# Patient Record
Sex: Male | Born: 2000 | Race: White | Hispanic: No | Marital: Married | Smoking: Never smoker
Health system: Southern US, Community
[De-identification: ages and names within clinical notes are randomized; demographics above are authoritative.]

## PROBLEM LIST (undated history)

## (undated) DIAGNOSIS — F909 Attention-deficit hyperactivity disorder, unspecified type: Secondary | ICD-10-CM

## (undated) HISTORY — PX: OTHER SURGICAL HISTORY: SHX169

---
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
## 2021-05-14 ENCOUNTER — Other Ambulatory Visit: Payer: Self-pay

## 2021-05-14 ENCOUNTER — Emergency Department (HOSPITAL_COMMUNITY): Payer: BLUE CROSS/BLUE SHIELD

## 2021-05-14 ENCOUNTER — Emergency Department (HOSPITAL_COMMUNITY)
Admission: EM | Admit: 2021-05-14 | Discharge: 2021-05-14 | Disposition: A | Discharge status: Home / Self Care | Payer: BLUE CROSS/BLUE SHIELD | Attending: Emergency Medicine | Admitting: Emergency Medicine

## 2021-05-14 ENCOUNTER — Encounter (HOSPITAL_COMMUNITY): Payer: Self-pay | Admitting: Emergency Medicine

## 2021-05-14 DIAGNOSIS — F17200 Nicotine dependence, unspecified, uncomplicated: Secondary | ICD-10-CM | POA: Insufficient documentation

## 2021-05-14 DIAGNOSIS — R079 Chest pain, unspecified: Secondary | ICD-10-CM | POA: Diagnosis present

## 2021-05-14 DIAGNOSIS — R0789 Other chest pain: Secondary | ICD-10-CM | POA: Diagnosis not present

## 2021-05-14 LAB — TROPONIN I (HIGH SENSITIVITY)
Troponin I (High Sensitivity): <2 ng/L (ref <18)
Troponin I (High Sensitivity): <2 ng/L (ref <18)

## 2021-05-14 LAB — BASIC METABOLIC PANEL
Anion gap: 9 (ref 5–15)
BUN: 15 mg/dL (ref 6–20)
CO2: 25 mmol/L (ref 22–32)
Calcium: 9.2 mg/dL (ref 8.9–10.3)
Chloride: 104 mmol/L (ref 98–111)
Creatinine, Ser: 0.81 mg/dL (ref 0.61–1.24)
GFR, Estimated: >60 mL/min (ref >60)
Glucose, Bld: 100 mg/dL — ABNORMAL HIGH (ref 70–99)
Potassium: 3.8 mmol/L (ref 3.5–5.1)
Sodium: 138 mmol/L (ref 135–145)

## 2021-05-14 LAB — CBC
HCT: 48.2 % (ref 39.0–52.0)
Hemoglobin: 16.6 g/dL (ref 13.0–17.0)
MCH: 28.5 pg (ref 26.0–34.0)
MCHC: 34.4 g/dL (ref 30.0–36.0)
MCV: 82.7 fL (ref 80.0–100.0)
Platelets: 272 10*3/uL (ref 150–400)
RBC: 5.83 MIL/uL — ABNORMAL HIGH (ref 4.22–5.81)
RDW: 11.9 % (ref 11.5–15.5)
WBC: 8.7 10*3/uL (ref 4.0–10.5)
nRBC: 0 % (ref 0.0–0.2)

## 2021-05-14 NOTE — ED Triage Notes (Signed)
Pt reports he had sharp chest pain while at work. He was pushing a piece of equipment at the time. Reports pain is worse with a deep breath.

## 2021-05-14 NOTE — ED Provider Notes (Signed)
[facility] EMERGENCY DEPARTMENT Provider Note   CSN: [id number] Arrival date & time: 05/14/21  1042     History Chief Complaint  Patient presents with  . Chest Pain    Shane Palmer is a 20 y.o. male.  Patient presents to the ED for evaluation of sharp, anterior chest pain onset while at work. He was using a torque wrench at the time of the pain. Pain is primarily localized to the left upper anterior chest, mid-clavicular. Pain worse with deep inspiration and palpation. No shortness of breath, abdominal pain, nausea, vomiting, diaphoresis. Pain has improved, and has mostly abated at time of exam. No significant PMH. Early heart disease in family.   The history is provided by the patient.  Chest Pain Pain location:  L chest Pain quality: sharp   Pain radiates to:  Does not radiate Pain severity:  Moderate (at onset) Onset quality:  Sudden Progression:  Partially resolved Chronicity:  New Context: movement   Associated symptoms: no abdominal pain, no back pain, no cough, no diaphoresis, no lower extremity edema, no nausea, no shortness of breath, no syncope and no vomiting   Risk factors: male sex and smoking        Past Medical History:  Diagnosis Date  . ADHD (attention deficit hyperactivity disorder)     There are no problems to display for this patient.   Past Surgical History:  Procedure Laterality Date  . tubes in ears         Family History  Problem Relation Age of Onset  . Diabetes Other   . Heart failure Other     Social History   Tobacco Use  . Smoking status: Never Smoker  . Smokeless tobacco: Current User    Types: Chew  Substance Use Topics  . Alcohol use: No  . Drug use: No    Home Medications Prior to Admission medications   Medication Sig Start Date End Date Taking? Authorizing Provider  ibuprofen (ADVIL) 200 MG tablet Take 200 mg by mouth every 6 (six) hours as needed for fever, headache or mild pain.   Yes [provider]    Allergies    Patient has no known allergies.  Review of Systems   Review of Systems  Constitutional: Negative for diaphoresis.  Respiratory: Negative for cough and shortness of breath.   Cardiovascular: Positive for chest pain. Negative for syncope.  Gastrointestinal: Negative for abdominal pain, nausea and vomiting.  Musculoskeletal: Negative for back pain.  All other systems reviewed and are negative.   Physical Exam Updated Vital Signs BP 128/88   Pulse 71   Temp 98.7 F (37.1 C)   Resp 18   Ht 5\' 11"  (1.803 m)   Wt 103.9 kg   SpO2 98%   BMI 31.94 kg/m   Physical Exam Vitals and nursing note reviewed.  Constitutional:      General: He is not in acute distress.    Appearance: He is well-developed.  HENT:     Head: Normocephalic.  Eyes:     Pupils: Pupils are equal, round, and reactive to light.  Cardiovascular:     Rate and Rhythm: Normal rate and regular rhythm.     Heart sounds: Normal heart sounds.  Pulmonary:     Effort: Pulmonary effort is normal. No tachypnea.  Abdominal:     Palpations: Abdomen is soft.  Musculoskeletal:        General: Normal range of motion.     Cervical back: Normal  range of motion.  Skin:    General: Skin is warm and dry.  Neurological:     Mental Status: He is alert and oriented to person, place, and time.  Psychiatric:        Mood and Affect: Mood normal.        Behavior: Behavior normal.     ED Results / Procedures / Treatments   Labs (all labs ordered are listed, but only abnormal results are displayed) Labs Reviewed  BASIC METABOLIC PANEL - Abnormal; Notable for the following components:      Result Value   Glucose, Bld 100 (*)    All other components within normal limits  CBC - Abnormal; Notable for the following components:   RBC 5.83 (*)    All other components within normal limits  TROPONIN I (HIGH SENSITIVITY)  TROPONIN I (HIGH SENSITIVITY)    EKG None  Radiology DG Chest 2 View  Result Date:  05/14/2021 CLINICAL DATA:  Chest pain EXAM: CHEST - 2 VIEW COMPARISON:  11/20/2013 FINDINGS: Normal heart size and mediastinal contours. No acute infiltrate or edema. No effusion or pneumothorax. No acute osseous findings. IMPRESSION: No evidence of active disease. Electronically Signed   By: Marnee Spring M.D.   On: 05/14/2021 11:32    Procedures Procedures   Medications Ordered in ED Medications - No data to display  ED Course  I have reviewed the triage vital signs and the nursing notes.  Pertinent labs & imaging results that were available during my care of the patient were reviewed by me and considered in my medical decision making (see chart for details).    MDM Rules/Calculators/A&P                         Pain has mostly resolved. Likely MSK in origin.  Patient is to be discharged with recommendation to follow up with PCP in regards to today's hospital visit. Chest pain is not likely of cardiac or pulmonary etiology d/t presentation, perc negative, VSS, no tracheal deviation, no JVD or new murmur, RRR, breath sounds equal bilaterally, EKG without acute abnormalities, negative troponin, and negative CXR. Pt has been advised to return to the ED is CP becomes exertional, associated with diaphoresis or nausea, radiates to left jaw/arm, worsens or becomes concerning in any way. Pt appears reliable for follow up and is agreeable to discharge.    Final Clinical Impression(s) / ED Diagnoses Final diagnoses:  Chest wall pain    Rx / DC Orders ED Discharge Orders    None       Felicie Morn, NP 05/14/21 2322    Cheryll Cockayne, MD 05/17/21 2011

## 2021-05-14 NOTE — Discharge Instructions (Signed)
Ibuprofen and/or tylenol for discomfort. Please refer to the attached instructions.

## 2021-12-09 IMAGING — DX DG CHEST 2V
2 series · 2 of 2 positions shown · non-contrast
Comparison: 11/20/2013

CLINICAL DATA: Chest pain

EXAM:
CHEST - 2 VIEW

[chest pa]
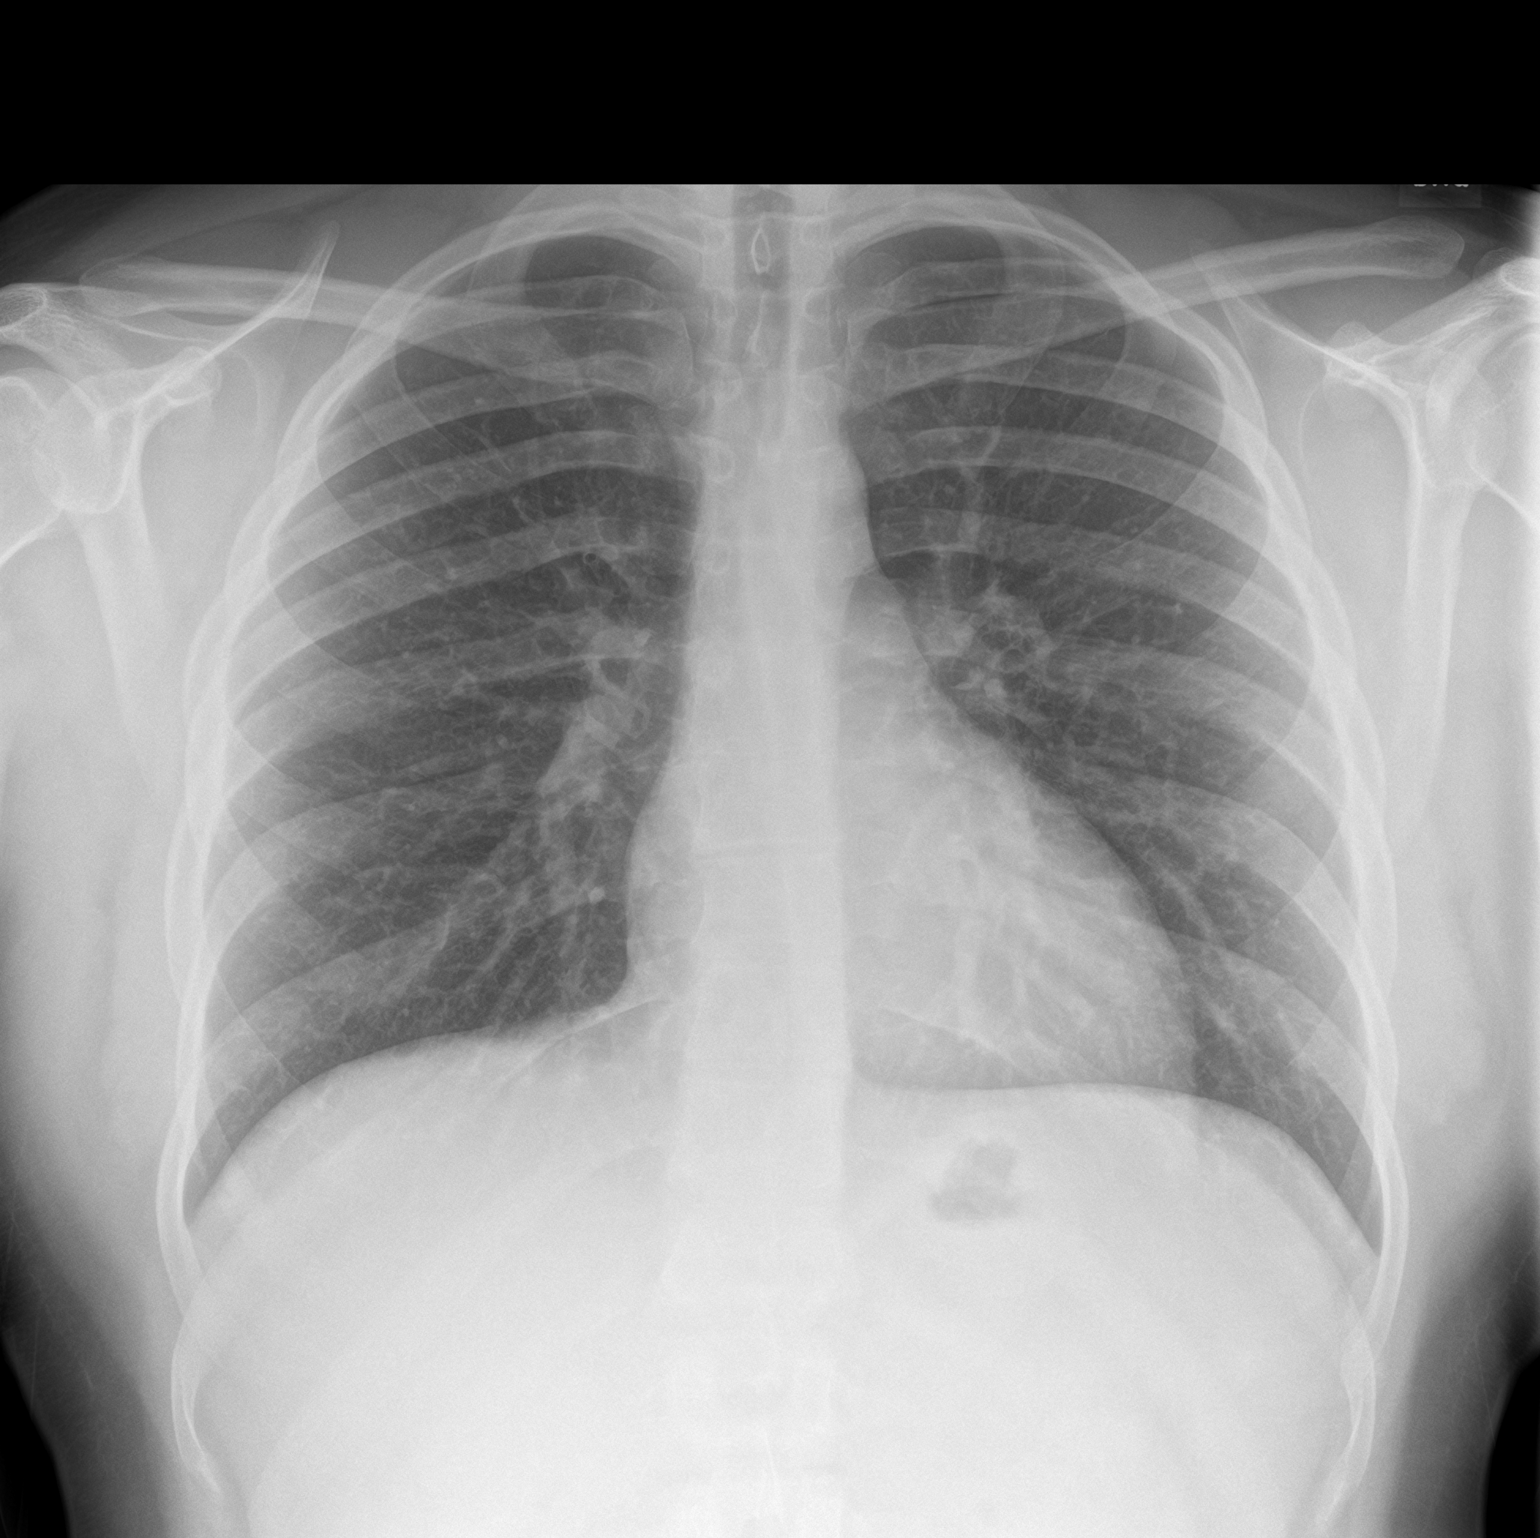

[chest lat]
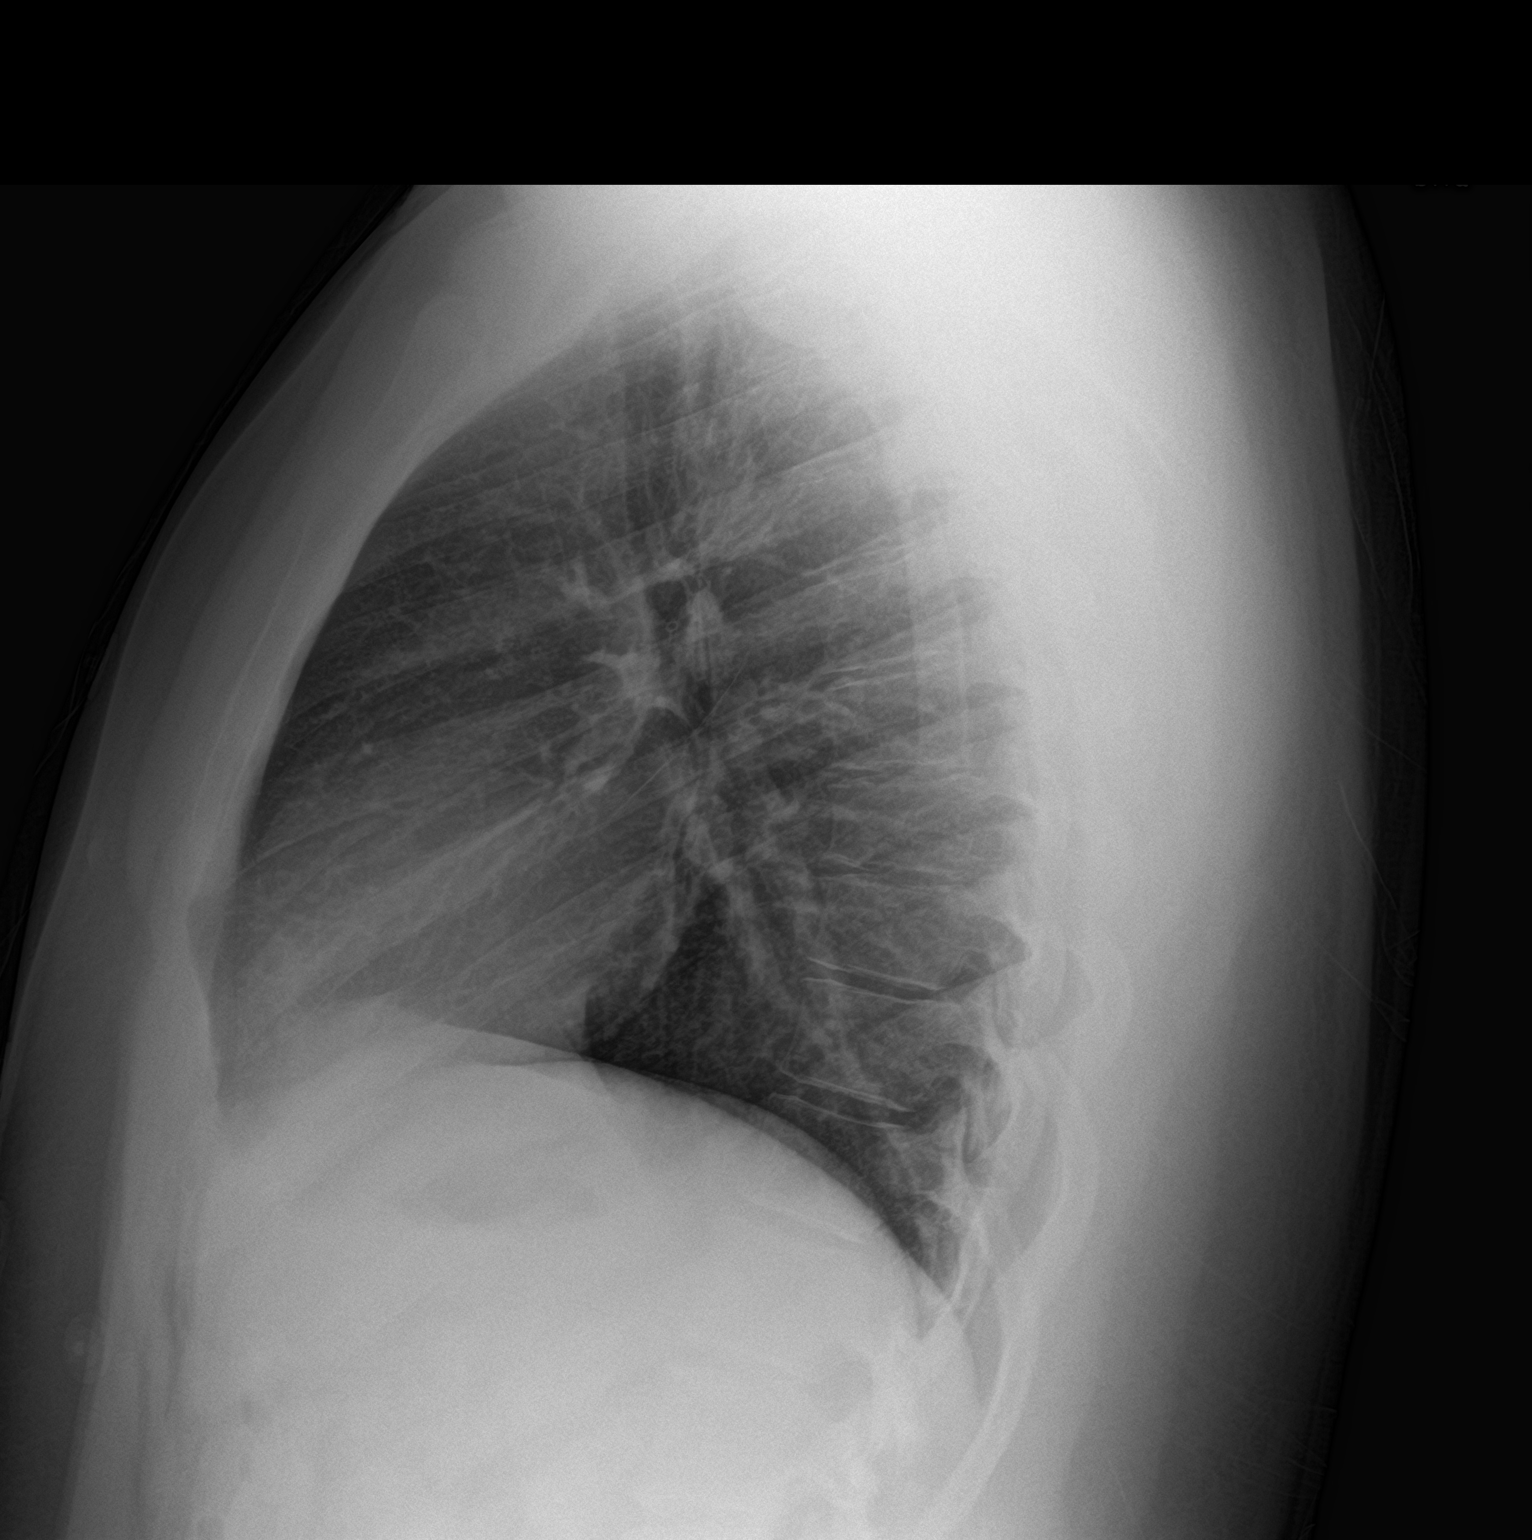

[2 of 2 positions shown; findings below may reference images not displayed]

FINDINGS: Normal heart size and mediastinal contours. No acute infiltrate or
edema. No effusion or pneumothorax. No acute osseous findings.
IMPRESSION: No evidence of active disease.

## 2023-07-05 ENCOUNTER — Encounter (HOSPITAL_COMMUNITY): Payer: Self-pay | Admitting: Emergency Medicine

## 2023-07-05 ENCOUNTER — Emergency Department (HOSPITAL_COMMUNITY): Payer: BLUE CROSS/BLUE SHIELD

## 2023-07-05 ENCOUNTER — Other Ambulatory Visit: Payer: Self-pay

## 2023-07-05 ENCOUNTER — Emergency Department (HOSPITAL_COMMUNITY)
Admission: EM | Admit: 2023-07-05 | Discharge: 2023-07-05 | Disposition: A | Discharge status: Home / Self Care | Payer: BLUE CROSS/BLUE SHIELD | Attending: Emergency Medicine | Admitting: Emergency Medicine

## 2023-07-05 DIAGNOSIS — Z720 Tobacco use: Secondary | ICD-10-CM | POA: Insufficient documentation

## 2023-07-05 DIAGNOSIS — R1011 Right upper quadrant pain: Secondary | ICD-10-CM | POA: Diagnosis not present

## 2023-07-05 LAB — COMPREHENSIVE METABOLIC PANEL
ALT: 32 U/L (ref 0–44)
AST: 20 U/L (ref 15–41)
Albumin: 4.3 g/dL (ref 3.5–5.0)
Alkaline Phosphatase: 67 U/L (ref 38–126)
Anion gap: 8 (ref 5–15)
BUN: 13 mg/dL (ref 6–20)
CO2: 28 mmol/L (ref 22–32)
Calcium: 9.2 mg/dL (ref 8.9–10.3)
Chloride: 104 mmol/L (ref 98–111)
Creatinine, Ser: 0.97 mg/dL (ref 0.61–1.24)
GFR, Estimated: >60 mL/min (ref >60)
Glucose, Bld: 104 mg/dL — ABNORMAL HIGH (ref 70–99)
Potassium: 3.6 mmol/L (ref 3.5–5.1)
Sodium: 140 mmol/L (ref 135–145)
Total Bilirubin: 0.5 mg/dL (ref 0.3–1.2)
Total Protein: 7.2 g/dL (ref 6.5–8.1)

## 2023-07-05 LAB — CBC
HCT: 45.7 % (ref 39.0–52.0)
Hemoglobin: 15.4 g/dL (ref 13.0–17.0)
MCH: 27.7 pg (ref 26.0–34.0)
MCHC: 33.7 g/dL (ref 30.0–36.0)
MCV: 82.2 fL (ref 80.0–100.0)
Platelets: 288 10*3/uL (ref 150–400)
RBC: 5.56 MIL/uL (ref 4.22–5.81)
RDW: 12.1 % (ref 11.5–15.5)
WBC: 9.6 10*3/uL (ref 4.0–10.5)
nRBC: 0 % (ref 0.0–0.2)

## 2023-07-05 LAB — URINALYSIS, ROUTINE W REFLEX MICROSCOPIC
Bilirubin Urine: NEGATIVE
Glucose, UA: NEGATIVE mg/dL
Hgb urine dipstick: NEGATIVE
Ketones, ur: NEGATIVE mg/dL
Leukocytes,Ua: NEGATIVE
Nitrite: NEGATIVE
Protein, ur: NEGATIVE mg/dL
Specific Gravity, Urine: 1.039 — ABNORMAL HIGH (ref 1.005–1.030)
pH: 6 (ref 5.0–8.0)

## 2023-07-05 LAB — LIPASE, BLOOD: Lipase: 30 U/L (ref 11–51)

## 2023-07-05 MED ORDER — IBUPROFEN 800 MG PO TABS: 800.0000 mg | ORAL_TABLET | Freq: Three times a day (TID) | ORAL | 0 refills | Status: DC | PRN

## 2023-07-05 MED ORDER — KETOROLAC TROMETHAMINE 30 MG/ML IJ SOLN
30.0000 mg | Freq: Once | INTRAMUSCULAR | Status: AC
  Administered 2023-07-05: 30 mg via INTRAVENOUS
  Filled 2023-07-05: qty 1

## 2023-07-05 MED ORDER — IOHEXOL 300 MG/ML  SOLN
100.0000 mL | Freq: Once | INTRAMUSCULAR | Status: AC | PRN
  Administered 2023-07-05: 100 mL via INTRAVENOUS

## 2023-07-05 NOTE — ED Triage Notes (Addendum)
Pt via POV c/o RUQ abdominal pain since this morning. Denies n/v/d. Pain is worse with palpation, does not radiate, and feels like burning/stabbing rated 7/10 intermittently. Last food intake was around noon and pt had soda about 20 mins PTA. No prior abdominal surgeries.

## 2023-07-05 NOTE — ED Provider Notes (Signed)
[place] EMERGENCY DEPARTMENT AT [facility] Provider Note   CSN: [id number] Arrival date & time: 07/05/23  1724     History {Add pertinent medical, surgical, social history, OB history to HPI:1} Chief Complaint  Patient presents with   Abdominal Pain    Shane Palmer is a 22 y.o. male.  He has no significant past medical history.  Complaining of significant right upper quadrant right lateral lower chest pain that started this morning while at work.  Seems to be worse with any kind of bending twisting.  Does not recall any trauma.  Not associate with any nausea vomiting diarrhea urinary symptoms fevers chills shortness of breath.  No prior surgical history no other instances of this pain in the past.  The history is provided by the patient.  Abdominal Pain Pain location:  RUQ Pain quality: stabbing   Pain severity:  Moderate Onset quality:  Sudden Duration:  1 day Timing:  Intermittent Progression:  Unchanged Chronicity:  New Relieved by:  Not moving Worsened by:  Movement and palpation Ineffective treatments:  None tried Associated symptoms: no cough, no diarrhea, no dysuria, no fever, no hematuria, no nausea, no shortness of breath and no vomiting        Home Medications Prior to Admission medications   Medication Sig Start Date End Date Taking? Authorizing Provider  ibuprofen (ADVIL) 200 MG tablet Take 200 mg by mouth every 6 (six) hours as needed for fever, headache or mild pain.    [provider]      Allergies    Patient has no known allergies.    Review of Systems   Review of Systems  Constitutional:  Negative for fever.  Respiratory:  Negative for cough and shortness of breath.   Gastrointestinal:  Positive for abdominal pain. Negative for diarrhea, nausea and vomiting.  Genitourinary:  Negative for dysuria and hematuria.    Physical Exam Updated Vital Signs BP (!) 140/87 (BP Location: Right Arm)   Pulse 64   Temp 98.2 F (36.8  C) (Oral)   Resp 20   Ht 5\' 11"  (1.803 m)   Wt 99.8 kg   SpO2 100%   BMI 30.68 kg/m  Physical Exam Vitals and nursing note reviewed.  Constitutional:      General: He is not in acute distress.    Appearance: He is well-developed.  HENT:     Head: Normocephalic and atraumatic.  Eyes:     Conjunctiva/sclera: Conjunctivae normal.  Cardiovascular:     Rate and Rhythm: Normal rate and regular rhythm.     Heart sounds: No murmur heard. Pulmonary:     Effort: Pulmonary effort is normal. No respiratory distress.     Breath sounds: Normal breath sounds.  Chest:    Abdominal:     Palpations: Abdomen is soft. There is no mass.     Tenderness: There is abdominal tenderness in the right upper quadrant. There is no guarding or rebound.  Musculoskeletal:        General: No deformity. Normal range of motion.     Cervical back: Neck supple.  Skin:    General: Skin is warm and dry.     Capillary Refill: Capillary refill takes less than 2 seconds.  Neurological:     General: No focal deficit present.     Mental Status: He is alert.     ED Results / Procedures / Treatments   Labs (all labs ordered are listed, but only abnormal results are displayed)  Labs Reviewed  COMPREHENSIVE METABOLIC PANEL - Abnormal; Notable for the following components:      Result Value   Glucose, Bld 104 (*)    All other components within normal limits  LIPASE, BLOOD  CBC  URINALYSIS, ROUTINE W REFLEX MICROSCOPIC    EKG None  Radiology No results found.  Procedures Procedures  {Document cardiac monitor, telemetry assessment procedure when appropriate:1}  Medications Ordered in ED Medications - No data to display  ED Course/ Medical Decision Making/ A&P   {   Click here for ABCD2, HEART and other calculatorsREFRESH Note before signing :1}                          Medical Decision Making Amount and/or Complexity of Data Reviewed Labs: ordered.   This patient complains of ***; this  involves an extensive number of treatment Options and is a complaint that carries with it a high risk of complications and morbidity. The differential includes ***  I ordered, reviewed and interpreted labs, which included *** I ordered medication *** and reviewed PMP when indicated. I ordered imaging studies which included *** and I independently    visualized and interpreted imaging which showed *** Additional history obtained from *** Previous records obtained and reviewed *** I consulted *** and discussed lab and imaging findings and discussed disposition.  Cardiac monitoring reviewed, *** Social determinants considered, *** Critical Interventions: ***  After the interventions stated above, I reevaluated the patient and found *** Admission and further testing considered, ***   {Document critical care time when appropriate:1} {Document review of labs and clinical decision tools ie heart score, Chads2Vasc2 etc:1}  {Document your independent review of radiology images, and any outside records:1} {Document your discussion with family members, caretakers, and with consultants:1} {Document social determinants of health affecting pt's care:1} {Document your decision making why or why not admission, treatments were needed:1} Final Clinical Impression(s) / ED Diagnoses Final diagnoses:  None    Rx / DC Orders ED Discharge Orders     None

## 2023-07-05 NOTE — Discharge Instructions (Signed)
You are seen in the emergency department for right upper quadrant pain.  You had blood work and a CAT scan of your abdomen that did not show any abnormalities.  This is likely muscular.  You can try ice to the area and we are prescribing ibuprofen.  Follow-up with your regular doctor.  Return to the emergency department if any worsening or concerning symptoms.

## 2023-10-24 ENCOUNTER — Other Ambulatory Visit: Payer: Self-pay

## 2023-10-24 ENCOUNTER — Encounter (HOSPITAL_COMMUNITY): Payer: Self-pay

## 2023-10-24 ENCOUNTER — Emergency Department (HOSPITAL_COMMUNITY)
Admission: EM | Admit: 2023-10-24 | Discharge: 2023-10-24 | Disposition: A | Discharge status: Home / Self Care | Payer: BLUE CROSS/BLUE SHIELD | Attending: Emergency Medicine | Admitting: Emergency Medicine

## 2023-10-24 DIAGNOSIS — M549 Dorsalgia, unspecified: Secondary | ICD-10-CM | POA: Diagnosis present

## 2023-10-24 DIAGNOSIS — M5431 Sciatica, right side: Secondary | ICD-10-CM

## 2023-10-24 DIAGNOSIS — M545 Low back pain, unspecified: Secondary | ICD-10-CM | POA: Diagnosis not present

## 2023-10-24 MED ORDER — IBUPROFEN 800 MG PO TABS: 800.0000 mg | ORAL_TABLET | Freq: Four times a day (QID) | ORAL | 0 refills | Status: AC | PRN

## 2023-10-24 MED ORDER — KETOROLAC TROMETHAMINE 60 MG/2ML IM SOLN
60.0000 mg | Freq: Once | INTRAMUSCULAR | Status: AC
  Administered 2023-10-24: 60 mg via INTRAMUSCULAR
  Filled 2023-10-24: qty 2

## 2023-10-24 MED ORDER — LIDOCAINE 5 % EX PTCH: 1.0000 | MEDICATED_PATCH | CUTANEOUS | 0 refills | Status: AC

## 2023-10-24 MED ORDER — DEXAMETHASONE SODIUM PHOSPHATE 10 MG/ML IJ SOLN
10.0000 mg | Freq: Once | INTRAMUSCULAR | Status: AC
  Administered 2023-10-24: 10 mg via INTRAMUSCULAR
  Filled 2023-10-24: qty 1

## 2023-10-24 MED ORDER — METHOCARBAMOL 500 MG PO TABS: 500.0000 mg | ORAL_TABLET | Freq: Three times a day (TID) | ORAL | 0 refills | Status: AC | PRN

## 2023-10-24 MED ORDER — HYDROCODONE-ACETAMINOPHEN 5-325 MG PO TABS: 1.0000 | ORAL_TABLET | ORAL | 0 refills | Status: AC | PRN

## 2023-10-24 MED ORDER — METHYLPREDNISOLONE 4 MG PO TBPK: ORAL_TABLET | ORAL | 0 refills | Status: AC

## 2023-10-24 NOTE — ED Provider Notes (Signed)
[place] EMERGENCY DEPARTMENT AT [facility] Provider Note   CSN: [id number] Arrival date & time: 10/24/23  4098     History  Chief Complaint  Patient presents with   Back Pain    Shane Palmer is a 22 y.o. male.  Presents to the emergency department for evaluation of back pain.  Symptoms present for 3 days.  No known injury.  Patient reports that the pain radiates down the back of the thigh to about the knee area.  No change in bowel or bladder function.  No loss of strength or sensation in the lower extremities.  Pain worsens with movement of the right leg or bending at the waist.       Home Medications Prior to Admission medications   Medication Sig Start Date End Date Taking? Authorizing Provider  ibuprofen (ADVIL) 800 MG tablet Take 1 tablet (800 mg total) by mouth every 6 (six) hours as needed for moderate pain (pain score 4-6). 10/24/23  Yes Ramonita Koenig, Canary Brim, MD  lidocaine (LIDODERM) 5 % Place 1 patch onto the skin daily. Remove & Discard patch within 12 hours or as directed by MD 10/24/23  Yes Areona Homer, Canary Brim, MD  methocarbamol (ROBAXIN) 500 MG tablet Take 1 tablet (500 mg total) by mouth every 8 (eight) hours as needed for muscle spasms. 10/24/23  Yes Yailyn Strack, Canary Brim, MD  methylPREDNISolone (MEDROL DOSEPAK) 4 MG TBPK tablet As directed 10/24/23  Yes Kruze Atchley, Canary Brim, MD      Allergies    Patient has no known allergies.    Review of Systems   Review of Systems  Physical Exam Updated Vital Signs BP (!) 134/93 (BP Location: Left Arm)   Pulse 64   Temp 97.7 F (36.5 C)   Resp 17   Ht 5\' 11"  (1.803 m)   Wt 102.1 kg   SpO2 98%   BMI 31.38 kg/m  Physical Exam Vitals and nursing note reviewed.  Constitutional:      Appearance: Normal appearance.  HENT:     Head: Atraumatic.  Cardiovascular:     Rate and Rhythm: Normal rate and regular rhythm.  Pulmonary:     Effort: Pulmonary effort is normal.  Musculoskeletal:      Lumbar back: Tenderness present. Positive left straight leg raise test.       Back:  Skin:    Findings: No rash.  Neurological:     General: No focal deficit present.     Mental Status: He is alert and oriented to person, place, and time.     Sensory: Sensation is intact.     Motor: Motor function is intact. No weakness.     Deep Tendon Reflexes:     Reflex Scores:      Patellar reflexes are 1+ on the right side and 1+ on the left side.    Comments: No saddle anesthesia, no foot drop     ED Results / Procedures / Treatments   Labs (all labs ordered are listed, but only abnormal results are displayed) Labs Reviewed - No data to display  EKG None  Radiology No results found.  Procedures Procedures    Medications Ordered in ED Medications  ketorolac (TORADOL) injection 60 mg (has no administration in time range)  dexamethasone (DECADRON) injection 10 mg (has no administration in time range)    ED Course/ Medical Decision Making/ A&P  Medical Decision Making  Patient presents to the ER with musculoskeletal back pain. Examination reveals back tenderness without any associated neurologic findings. Patient's strength, sensation and reflexes were normal. There is no evidence of saddle anesthesia. Patient does not have a foot drop. Patient has not experienced any change in bowel or bladder function. As such, patient did not require any imaging or further studies. Patient was treated with analgesia.        Final Clinical Impression(s) / ED Diagnoses Final diagnoses:  Sciatica of right side    Rx / DC Orders ED Discharge Orders          Ordered    methylPREDNISolone (MEDROL DOSEPAK) 4 MG TBPK tablet        10/24/23 0503    methocarbamol (ROBAXIN) 500 MG tablet  Every 8 hours PRN        10/24/23 0503    lidocaine (LIDODERM) 5 %  Every 24 hours        10/24/23 0503    ibuprofen (ADVIL) 800 MG tablet  Every 6 hours PRN         10/24/23 0503              Gilda Crease, MD 10/24/23 [phone]

## 2023-10-24 NOTE — ED Triage Notes (Signed)
Pt states he is having right sided lower back pain that runs down his leg to his knee that is getting increasingly worse. Denies injury.
